# Patient Record
Sex: Female | Born: 1960 | Race: White | Hispanic: No | Marital: Married | Smoking: Former smoker
Health system: Southern US, Community
[De-identification: ages and names within clinical notes are randomized; demographics above are authoritative.]

## PROBLEM LIST (undated history)

## (undated) DIAGNOSIS — M8580 Other specified disorders of bone density and structure, unspecified site: Secondary | ICD-10-CM

## (undated) HISTORY — PX: OTHER SURGICAL HISTORY: SHX169

---

## 2014-02-24 ENCOUNTER — Emergency Department: Payer: Self-pay | Admitting: Emergency Medicine

## 2014-02-24 LAB — CBC WITH DIFFERENTIAL/PLATELET
BASOS PCT: 0.9 %
Basophil #: 0.1 10*3/uL (ref 0.0–0.1)
Eosinophil #: 0.1 10*3/uL (ref 0.0–0.7)
Eosinophil %: 2.3 %
HCT: 36.7 % (ref 35.0–47.0)
HGB: 12.7 g/dL (ref 12.0–16.0)
LYMPHS ABS: 2 10*3/uL (ref 1.0–3.6)
Lymphocyte %: 29.9 %
MCH: 33.5 pg (ref 26.0–34.0)
MCHC: 34.5 g/dL (ref 32.0–36.0)
MCV: 97 fL (ref 80–100)
Monocyte #: 0.7 x10 3/mm (ref 0.2–0.9)
Monocyte %: 11.1 %
Neutrophil #: 3.7 10*3/uL (ref 1.4–6.5)
Neutrophil %: 55.8 %
Platelet: 260 10*3/uL (ref 150–440)
RBC: 3.78 10*6/uL — ABNORMAL LOW (ref 3.80–5.20)
RDW: 13 % (ref 11.5–14.5)
WBC: 6.5 10*3/uL (ref 3.6–11.0)

## 2014-02-24 LAB — BASIC METABOLIC PANEL
ANION GAP: 8 (ref 7–16)
BUN: 10 mg/dL (ref 7–18)
CO2: 28 mmol/L (ref 21–32)
Calcium, Total: 7.7 mg/dL — ABNORMAL LOW (ref 8.5–10.1)
Chloride: 108 mmol/L — ABNORMAL HIGH (ref 98–107)
Creatinine: 0.6 mg/dL (ref 0.60–1.30)
GLUCOSE: 108 mg/dL — AB (ref 65–99)
Osmolality: 286 (ref 275–301)
Potassium: 3.6 mmol/L (ref 3.5–5.1)
SODIUM: 144 mmol/L (ref 136–145)

## 2014-02-24 LAB — TROPONIN I: Troponin-I: <0.02 ng/mL

## 2014-02-24 LAB — URINALYSIS, COMPLETE
Bacteria: NONE SEEN
Bilirubin,UR: NEGATIVE
Glucose,UR: NEGATIVE mg/dL (ref 0–75)
Leukocyte Esterase: NEGATIVE
NITRITE: NEGATIVE
PH: 5 (ref 4.5–8.0)
RBC,UR: 14 /HPF (ref 0–5)
SPECIFIC GRAVITY: 1.019 (ref 1.003–1.030)
Squamous Epithelial: 1
WBC UR: 3 /HPF (ref 0–5)

## 2014-04-13 ENCOUNTER — Ambulatory Visit: Payer: Self-pay | Admitting: Family Medicine

## 2014-04-25 ENCOUNTER — Ambulatory Visit: Payer: Self-pay | Admitting: Family Medicine

## 2015-06-28 ENCOUNTER — Other Ambulatory Visit: Payer: Self-pay | Admitting: Gynecology

## 2015-06-28 DIAGNOSIS — Z1231 Encounter for screening mammogram for malignant neoplasm of breast: Secondary | ICD-10-CM

## 2015-06-28 DIAGNOSIS — R928 Other abnormal and inconclusive findings on diagnostic imaging of breast: Secondary | ICD-10-CM

## 2015-07-20 ENCOUNTER — Ambulatory Visit
Admission: RE | Admit: 2015-07-20 | Discharge: 2015-07-20 | Disposition: A | Discharge status: Home / Self Care | Payer: BLUE CROSS/BLUE SHIELD | Source: Ambulatory Visit | Attending: Gynecology | Admitting: Gynecology

## 2015-07-20 DIAGNOSIS — Z1231 Encounter for screening mammogram for malignant neoplasm of breast: Secondary | ICD-10-CM

## 2015-07-20 DIAGNOSIS — R928 Other abnormal and inconclusive findings on diagnostic imaging of breast: Secondary | ICD-10-CM

## 2015-08-08 ENCOUNTER — Other Ambulatory Visit: Payer: Self-pay | Admitting: Gynecology

## 2015-08-08 DIAGNOSIS — Z13820 Encounter for screening for osteoporosis: Secondary | ICD-10-CM

## 2015-08-09 ENCOUNTER — Other Ambulatory Visit: Payer: Self-pay | Admitting: Gynecology

## 2015-08-09 DIAGNOSIS — R928 Other abnormal and inconclusive findings on diagnostic imaging of breast: Secondary | ICD-10-CM

## 2015-09-13 ENCOUNTER — Ambulatory Visit: Payer: BLUE CROSS/BLUE SHIELD | Attending: Gynecology

## 2015-10-10 ENCOUNTER — Ambulatory Visit
Admission: RE | Admit: 2015-10-10 | Discharge: 2015-10-10 | Disposition: A | Discharge status: Home / Self Care | Payer: BLUE CROSS/BLUE SHIELD | Source: Ambulatory Visit | Attending: Gynecology | Admitting: Gynecology

## 2015-10-10 DIAGNOSIS — M8588 Other specified disorders of bone density and structure, other site: Secondary | ICD-10-CM | POA: Diagnosis not present

## 2015-10-10 DIAGNOSIS — M85851 Other specified disorders of bone density and structure, right thigh: Secondary | ICD-10-CM | POA: Diagnosis not present

## 2015-10-10 DIAGNOSIS — Z13820 Encounter for screening for osteoporosis: Secondary | ICD-10-CM | POA: Diagnosis not present

## 2016-07-21 ENCOUNTER — Ambulatory Visit
Admission: RE | Admit: 2016-07-21 | Discharge: 2016-07-21 | Disposition: A | Discharge status: Home / Self Care | Payer: BLUE CROSS/BLUE SHIELD | Source: Ambulatory Visit | Attending: Gynecology | Admitting: Gynecology

## 2016-07-21 DIAGNOSIS — R928 Other abnormal and inconclusive findings on diagnostic imaging of breast: Secondary | ICD-10-CM

## 2016-07-21 DIAGNOSIS — R922 Inconclusive mammogram: Secondary | ICD-10-CM | POA: Diagnosis present

## 2016-07-21 DIAGNOSIS — R921 Mammographic calcification found on diagnostic imaging of breast: Secondary | ICD-10-CM | POA: Insufficient documentation

## 2017-05-06 ENCOUNTER — Encounter: Payer: Self-pay | Admitting: Emergency Medicine

## 2017-05-06 ENCOUNTER — Other Ambulatory Visit: Payer: Self-pay

## 2017-05-06 ENCOUNTER — Ambulatory Visit
Admission: EM | Admit: 2017-05-06 | Discharge: 2017-05-06 | Disposition: A | Discharge status: Home / Self Care | Payer: BLUE CROSS/BLUE SHIELD | Attending: Internal Medicine | Admitting: Internal Medicine

## 2017-05-06 DIAGNOSIS — R300 Dysuria: Secondary | ICD-10-CM

## 2017-05-06 DIAGNOSIS — R350 Frequency of micturition: Secondary | ICD-10-CM

## 2017-05-06 HISTORY — DX: Other specified disorders of bone density and structure, unspecified site: M85.80

## 2017-05-06 LAB — URINALYSIS, COMPLETE (UACMP) WITH MICROSCOPIC
Bilirubin Urine: NEGATIVE
GLUCOSE, UA: NEGATIVE mg/dL
KETONES UR: NEGATIVE mg/dL
NITRITE: POSITIVE — AB
PH: 7 (ref 5.0–8.0)
Protein, ur: 30 mg/dL — AB
SPECIFIC GRAVITY, URINE: 1.015 (ref 1.005–1.030)
Squamous Epithelial / LPF: NONE SEEN

## 2017-05-06 MED ORDER — NITROFURANTOIN MONOHYD MACRO 100 MG PO CAPS: 100.0000 mg | ORAL_CAPSULE | Freq: Two times a day (BID) | ORAL | 0 refills | Status: DC

## 2017-05-06 MED ORDER — PHENAZOPYRIDINE HCL 200 MG PO TABS: 200.0000 mg | ORAL_TABLET | Freq: Three times a day (TID) | ORAL | 0 refills | Status: DC

## 2017-05-06 NOTE — ED Provider Notes (Signed)
[facility]    CSN: [id number] Arrival date & time: 05/06/17  1854     History   Chief Complaint Chief Complaint  Patient presents with  . Dysuria    HPI Jennifer Mccarty is a 57 y.o. female.   She presents today with recent history of intermittent flank/abdominal pain over the last couple weeks.  She had the onset today of severe urinary frequency, and dysuria.  No fever.  No vaginal discharge.  Has had some crampy abdominal discomfort and thought she might have diarrhea but has not.    HPI  Past Medical History:  Diagnosis Date  . Osteopenia     Past Surgical History:  Procedure Laterality Date  . kidney stone removal      Home Medications    Prior to Admission medications   Medication Sig Start Date End Date Taking? Authorizing Provider  Calcium-Magnesium-Vitamin D (CALCIUM 500 PO) Take 3 tablets by mouth daily.   Yes [provider]  Cholecalciferol (VITAMIN D3 PO) Take 1 tablet by mouth daily.   Yes [provider]  Sesame Oil OIL by Does not apply route 3 (three) times daily.   Yes [provider]  UNABLE TO FIND Biost   Yes [provider]  vitamin B-12 (CYANOCOBALAMIN) 1000 MCG tablet Take by mouth.   Yes [provider]  nitrofurantoin, macrocrystal-monohydrate, (MACROBID) 100 MG capsule Take 1 capsule (100 mg total) by mouth 2 (two) times daily. 05/06/17   Isa Rankin, MD  phenazopyridine (PYRIDIUM) 200 MG tablet Take 1 tablet (200 mg total) by mouth 3 (three) times daily. 05/06/17   Isa Rankin, MD    Family History Family History  Problem Relation Age of Onset  . Diabetes Mother   . Hemangiomas Mother        brain  . Pancreatic cancer Father   . Diabetes Father   . Hypertension Father   . Breast cancer Neg Hx     Social History Social History   Tobacco Use  . Smoking status: Former Smoker    Last attempt to quit: 1983    Years since quitting: 36.2  . Smokeless  tobacco: Never Used  Substance Use Topics  . Alcohol use: Never    Frequency: Never  . Drug use: Never     Allergies   Codeine and Wool alcohol  [lanolin]   Review of Systems Review of Systems  All other systems reviewed and are negative.    Physical Exam Triage Vital Signs ED Triage Vitals  Enc Vitals Group     BP 05/06/17 1903 98/61     Pulse Rate 05/06/17 1903 (!) 51     Resp 05/06/17 1903 16     Temp 05/06/17 1903 (!) 97.4 F (36.3 C)     Temp Source 05/06/17 1903 Oral     SpO2 05/06/17 1903 100 %     Weight 05/06/17 1904 113 lb (51.3 kg)     Height 05/06/17 1904 5\' 1"  (1.549 m)     Pain Score 05/06/17 1904 5     Pain Loc --    Updated Vital Signs BP 98/61 (BP Location: Left Arm)   Pulse (!) 51   Temp (!) 97.4 F (36.3 C) (Oral)   Resp 16   Ht 5\' 1"  (1.549 m)   Wt 113 lb (51.3 kg)   SpO2 100%   BMI 21.35 kg/m  Physical Exam  Constitutional: She is oriented to person, place, and time. No  distress.  HENT:  Head: Atraumatic.  Eyes:  Conjugate gaze observed, no eye redness/discharge  Neck: Neck supple.  Cardiovascular: Normal rate.  Pulmonary/Chest: No respiratory distress.  Abdominal: She exhibits no distension.  Musculoskeletal: Normal range of motion.  Neurological: She is alert and oriented to person, place, and time.  Skin: Skin is warm and dry.  Nursing note and vitals reviewed.    UC Treatments / Results  Labs Results for orders placed or performed during the hospital encounter of 05/06/17  Urine culture  Result Value Ref Range   Specimen Description      URINE, CLEAN CATCH Performed at [facility], [address] [place], [place] [address]    Special Requests      NONE Performed at [facility], [address] [place], [place] [address]    Culture >=100,000 COLONIES/mL ESCHERICHIA COLI (A)    Report Status 05/09/2017 FINAL    Organism ID, Bacteria ESCHERICHIA COLI (A)       Susceptibility   Escherichia  coli - MIC*    AMPICILLIN <=2 SENSITIVE Sensitive     CEFAZOLIN <=4 SENSITIVE Sensitive     CEFTRIAXONE <=1 SENSITIVE Sensitive     CIPROFLOXACIN <=0.25 SENSITIVE Sensitive     GENTAMICIN <=1 SENSITIVE Sensitive     IMIPENEM <=0.25 SENSITIVE Sensitive     NITROFURANTOIN <=16 SENSITIVE Sensitive     TRIMETH/SULFA <=20 SENSITIVE Sensitive     AMPICILLIN/SULBACTAM <=2 SENSITIVE Sensitive     PIP/TAZO <=4 SENSITIVE Sensitive     Extended ESBL NEGATIVE Sensitive     * >=100,000 COLONIES/mL ESCHERICHIA COLI  Urinalysis, Complete w Microscopic  Result Value Ref Range   Color, Urine YELLOW YELLOW   APPearance HAZY (A) CLEAR   Specific Gravity, Urine 1.015 1.005 - 1.030   pH 7.0 5.0 - 8.0   Glucose, UA NEGATIVE NEGATIVE mg/dL   Hgb urine dipstick LARGE (A) NEGATIVE   Bilirubin Urine NEGATIVE NEGATIVE   Ketones, ur NEGATIVE NEGATIVE mg/dL   Protein, ur 30 (A) NEGATIVE mg/dL   Nitrite POSITIVE (A) NEGATIVE   Leukocytes, UA MODERATE (A) NEGATIVE   Squamous Epithelial / LPF NONE SEEN NONE SEEN   WBC, UA 6-30 0 - 5 WBC/hpf   RBC / HPF TOO NUMEROUS TO COUNT 0 - 5 RBC/hpf   Bacteria, UA MANY (A) NONE SEEN    EKG None Radiology No results found.  Procedures Procedures (including critical care time) None today  Final Clinical Impressions(s) / UC Diagnoses   Final diagnoses:  Dysuria   Urine test at the urgent care today suggest a UTI; a urine culture is pending.  The urgent care will contact you if a change in treatment is needed.  Prescriptions for nitrofurantoin, antibiotic, and phenazopyridine, for urinary discomfort, were sent to the pharmacy.  Push fluids.  Recheck for further evaluation if symptoms are not improving in a few days.  ED Discharge Orders        Ordered    phenazopyridine (PYRIDIUM) 200 MG tablet  3 times daily     05/06/17 1950    nitrofurantoin, macrocrystal-monohydrate, (MACROBID) 100 MG capsule  2 times daily     05/06/17 1950        Isa RankinMurray, Myeasha Ballowe  Wilson, MD 05/09/17 2035

## 2017-05-06 NOTE — Discharge Instructions (Addendum)
Urine test at the urgent care today suggest a UTI; a urine culture is pending.  The urgent care will contact you if a change in treatment is needed.  Prescriptions for nitrofurantoin, antibiotic, and phenazopyridine, for urinary discomfort, were sent to the pharmacy.  Push fluids.  Recheck for further evaluation if symptoms are not improving in a few days.

## 2017-05-06 NOTE — ED Triage Notes (Signed)
Patient in today c/o urinary frequency, dysuria since this afternoon. Patient has a history of kidney stones and UTIs.

## 2017-05-09 LAB — URINE CULTURE

## 2017-05-15 ENCOUNTER — Other Ambulatory Visit: Payer: Self-pay

## 2017-05-15 ENCOUNTER — Ambulatory Visit
Admission: EM | Admit: 2017-05-15 | Discharge: 2017-05-15 | Disposition: A | Discharge status: Home / Self Care | Payer: BLUE CROSS/BLUE SHIELD | Attending: Emergency Medicine | Admitting: Emergency Medicine

## 2017-05-15 ENCOUNTER — Ambulatory Visit (INDEPENDENT_AMBULATORY_CARE_PROVIDER_SITE_OTHER): Payer: BLUE CROSS/BLUE SHIELD

## 2017-05-15 DIAGNOSIS — W01198A Fall on same level from slipping, tripping and stumbling with subsequent striking against other object, initial encounter: Secondary | ICD-10-CM

## 2017-05-15 DIAGNOSIS — S20211A Contusion of right front wall of thorax, initial encounter: Secondary | ICD-10-CM

## 2017-05-15 MED ORDER — HYDROCODONE-ACETAMINOPHEN 5-325 MG PO TABS: 1.0000 | ORAL_TABLET | Freq: Four times a day (QID) | ORAL | 0 refills | Status: AC | PRN

## 2017-05-15 NOTE — Discharge Instructions (Addendum)
There is no displaced rib fracture or collapsed lung on your x-rays, however, I still suspect that you may have a cracked rib.  I am giving you information on both a rib bruise and a rib fracture.  Take 400- 600 mg of ibuprofen with 1 g of Tylenol 3 or 4 times a day as needed for pain.  This is an effective combination for pain and provides relief almost equivalent to opiates.  Do not take both Norco and Tylenol but to take one or the other. Do Not exceed 4 g of Tylenol from all sources in 1 day.  You can get the incentive spirometer at a medical supply store.  Use the incentive spirometer several times an hour.  This will help prevent any pneumonia.  Follow-up with your primary care physician in a week if not getting better, go to ER if you get worse, fevers, pain not controlled with medications, shortness of breath, coughing up blood or other concerns.

## 2017-05-15 NOTE — ED Triage Notes (Signed)
Patient complains of right rib pain that occurred after a fall last night. Patient states that she was exercising [place] [employer] in [place] last night and caught her foot and fell to the ground with her body folded under her. Patient reports that area is very painful and hurts worse when taking a deep breath.

## 2017-05-15 NOTE — ED Provider Notes (Signed)
HPI  SUBJECTIVE:  Jennifer Mccarty is a 57 y.o. female who presents with constant, sore anterior right lower rib pain that becomes sharp with deep inspiration starting last night after she tripped and fell on the sidewalk.  States that she fell with her body folded onto her right rib cage.  She is not sure if she landed on her arm or knee.  Symptoms are worse with deep inspiration, torso rotation and pushing doors open.  She denies bruising, shortness of breath, dyspnea on exertion, hemoptysis, dizziness.  States that she cannot take a deep breath because of the pain.  No alleviating factors.  She has not tried anything for this.  He has a past medical history of osteopenia.  No history of rib fractures, pneumothorax, smoking, diabetes, hypertension, GI bleed, kidney disease.  PMD: [facility].    Past Medical History:  Diagnosis Date  . Osteopenia     Past Surgical History:  Procedure Laterality Date  . kidney stone removal      Family History  Problem Relation Age of Onset  . Diabetes Mother   . Hemangiomas Mother        brain  . Pancreatic cancer Father   . Diabetes Father   . Hypertension Father   . Breast cancer Neg Hx     Social History   Tobacco Use  . Smoking status: Former Smoker    Last attempt to quit: 1983    Years since quitting: 36.2  . Smokeless tobacco: Never Used  Substance Use Topics  . Alcohol use: Never    Frequency: Never  . Drug use: Never    No current facility-administered medications for this encounter.   Current Outpatient Medications:  .  Calcium-Magnesium-Vitamin D (CALCIUM 500 PO), Take 3 tablets by mouth daily., Disp: , Rfl:  .  Cholecalciferol (VITAMIN D3 PO), Take 1 tablet by mouth daily., Disp: , Rfl:  .  Sesame Oil OIL, by Does not apply route 3 (three) times daily., Disp: , Rfl:  .  UNABLE TO FIND, Biost, Disp: , Rfl:  .  vitamin B-12 (CYANOCOBALAMIN) 1000 MCG tablet, Take by mouth., Disp: , Rfl:  .   HYDROcodone-acetaminophen (NORCO/VICODIN) 5-325 MG tablet, Take 1-2 tablets by mouth every 6 (six) hours as needed for moderate pain or severe pain., Disp: 20 tablet, Rfl: 0  Allergies  Allergen Reactions  . Codeine Nausea Only  . Wool Alcohol  [Lanolin] Hives  . Adhesive [Tape] Rash     ROS  As noted in HPI.   Physical Exam  BP 92/65 (BP Location: Left Arm)   Pulse (!) 53   Temp 97.9 F (36.6 C) (Oral)   Resp 18   Ht 5\' 1"  (1.549 m)   Wt 114 lb (51.7 kg)   SpO2 100%   BMI 21.54 kg/m   Constitutional: Well developed, well nourished, mild painful distress Eyes:  EOMI, conjunctiva normal bilaterally HENT: Normocephalic, atraumatic,mucus membranes moist Respiratory:  Normal chest wall appearance.  No bruising.  Tenderness over the posterior right ribs.  No tenderness over the left entire rib cage.  Positive diffuse tenderness anteriorly, but positive exquisite point tenderness over right anterior rib where her underwire of the bra is located.  No paradoxical wall motion.  Limited inspiratory effort due to pain.  Lungs clear bilaterally. Cardiovascular: Regular bradycardia, no murmurs, rubs, gallops GI: nondistended skin: No rash, skin intact Musculoskeletal: No C-spine, T-spine tenderness.  No deformities Neurologic: Alert & oriented x 3, no focal neuro  deficits Psychiatric: Speech and behavior appropriate   ED Course   Medications - No data to display  Orders Placed This Encounter  Procedures  . DG Ribs Unilateral W/Chest Right    Standing Status:   Standing    Number of Occurrences:   1    Order Specific Question:   Reason for Exam (SYMPTOM  OR DIAGNOSIS REQUIRED)    Answer:   fall r/o R lower anterior rib fx    No results found for this or any previous visit (from the past 24 hour(s)). Dg Ribs Unilateral W/chest Right  Result Date: 05/15/2017 CLINICAL DATA:  Larey Seat last night with right anterior rib pain and difficulty breathing. EXAM: RIGHT RIBS AND CHEST - 3+  VIEW COMPARISON:  None. FINDINGS: Heart size is normal. Mediastinal shadows are normal. The lungs are clear. No pneumothorax or hemothorax. No rib fracture seen. Cartilage injury not excluded. IMPRESSION: Normal radiographs. No pneumothorax. No rib fracture seen. Cartilage injury not excluded. Electronically Signed   By: Paulina Fusi M.D.   On: 05/15/2017 14:52    ED Clinical Impression  Contusion of rib on right side, initial encounter   ED Assessment/Plan  [place] Narcotic database reviewed for this patient, and feel that the risk/benefit ratio today is favorable for proceeding with a prescription for controlled substance.  No Opiate prescriptions in 2 years.  Reviewed imaging independently.  No pneumothorax, displaced rib fracture.  See radiology report for full details.  1449-called radiology, the image is not available on their end yet.   Presentation consistent with a nondisplaced rib fracture vs contusion.  She does not have any evidence of pneumothorax or displaced rib fracture.  Will send home with 400- 600 mg of ibuprofen to take with 1 g of Tylenol 3-4 times a day as needed for pain, Norco for severe pain.  If that she does not need a prescription for ibuprofen.  Discussed with patient and family member for her to not take both Norco and Tylenol but to take one or the other.  Will write a prescription for an incentive spirometer.  She will need to follow-up with her primary care physician in a week if not getting better, ER if she gets worse, fevers, pain not controlled with medications, hemoptysis.  Discussed imaging, MDM, treatment plan, and plan for follow-up with patient. Discussed sn/sx that should prompt return to the ED. patient agrees with plan.   Meds ordered this encounter  Medications  . HYDROcodone-acetaminophen (NORCO/VICODIN) 5-325 MG tablet    Sig: Take 1-2 tablets by mouth every 6 (six) hours as needed for moderate pain or severe pain.    Dispense:  20 tablet    Refill:   0    *This clinic note was created using Scientist, clinical (histocompatibility and immunogenetics). Therefore, there may be occasional mistakes despite careful proofreading.   ?   Domenick Gong, MD 05/15/17 1501

## 2017-06-03 ENCOUNTER — Other Ambulatory Visit: Payer: Self-pay | Admitting: Gynecology

## 2017-06-03 DIAGNOSIS — R922 Inconclusive mammogram: Secondary | ICD-10-CM

## 2017-07-28 ENCOUNTER — Ambulatory Visit
Admission: RE | Admit: 2017-07-28 | Discharge: 2017-07-28 | Disposition: A | Discharge status: Home / Self Care | Payer: BLUE CROSS/BLUE SHIELD | Source: Ambulatory Visit | Attending: Gynecology | Admitting: Gynecology

## 2017-07-28 DIAGNOSIS — R922 Inconclusive mammogram: Secondary | ICD-10-CM

## 2017-07-28 DIAGNOSIS — R9230 Dense breasts, unspecified: Secondary | ICD-10-CM

## 2017-08-28 ENCOUNTER — Other Ambulatory Visit: Payer: Self-pay | Admitting: Gynecology

## 2017-08-28 DIAGNOSIS — Z13820 Encounter for screening for osteoporosis: Secondary | ICD-10-CM

## 2017-08-28 DIAGNOSIS — Z1231 Encounter for screening mammogram for malignant neoplasm of breast: Secondary | ICD-10-CM

## 2018-01-26 ENCOUNTER — Other Ambulatory Visit: Payer: Self-pay

## 2018-01-26 ENCOUNTER — Ambulatory Visit
Admission: EM | Admit: 2018-01-26 | Discharge: 2018-01-26 | Disposition: A | Discharge status: Home / Self Care | Payer: BLUE CROSS/BLUE SHIELD | Attending: Internal Medicine | Admitting: Internal Medicine

## 2018-01-26 DIAGNOSIS — M9918 Subluxation complex (vertebral) of rib cage: Secondary | ICD-10-CM | POA: Insufficient documentation

## 2018-01-26 NOTE — ED Triage Notes (Signed)
Patient complains of rib pain that occurred on 12/9 at 645 while exercising. States that she saw her chiropractor and was advised to come here and be seen for Chest x-ray. Patient states that she is dealing with a "red blood cell" issue and has not been exercising prior to this recently.

## 2018-09-01 ENCOUNTER — Other Ambulatory Visit: Payer: Self-pay | Admitting: Family Medicine

## 2018-09-01 ENCOUNTER — Other Ambulatory Visit: Payer: Self-pay | Admitting: Family

## 2018-09-01 DIAGNOSIS — Z1231 Encounter for screening mammogram for malignant neoplasm of breast: Secondary | ICD-10-CM

## 2018-10-27 ENCOUNTER — Ambulatory Visit: Payer: BLUE CROSS/BLUE SHIELD

## 2018-11-18 NOTE — ED Provider Notes (Signed)
[facility]    CSN: [id number] Arrival date & time: 01/26/18  0947      History   Chief Complaint Chief Complaint  Patient presents with  . Chest Pain    rib pain    HPI Jennifer Mccarty is a 58 y.o. female.   Pt c/o of pain along breast bone.  Started after a workout. She had not been exercising for a while and admits to chest exercises. Chest does not hurt with breathing, only with certain movement. Denies trauma to rib cage.  Denies SOB, N'V or diaphoresis.      Past Medical History:  Diagnosis Date  . Osteopenia     There are no active problems to display for this patient.   Past Surgical History:  Procedure Laterality Date  . kidney stone removal      OB History   No obstetric history on file.      Home Medications    Prior to Admission medications   Medication Sig Start Date End Date Taking? Authorizing Provider  Calcium-Magnesium-Vitamin D (CALCIUM 500 PO) Take 3 tablets by mouth daily.   Yes [provider]  Cholecalciferol (VITAMIN D3 PO) Take 1 tablet by mouth daily.   Yes [provider]  lactobacillus acidophilus (BACID) TABS tablet Take 2 tablets by mouth 3 (three) times daily.   Yes [provider]  levothyroxine (SYNTHROID, LEVOTHROID) 25 MCG tablet Take by mouth. 11/24/17 11/24/18 Yes [provider]  UNABLE TO FIND Biost   Yes [provider]  vitamin B-12 (CYANOCOBALAMIN) 1000 MCG tablet Take by mouth.   Yes [provider]  HYDROcodone-acetaminophen (NORCO/VICODIN) 5-325 MG tablet Take 1-2 tablets by mouth every 6 (six) hours as needed for moderate pain or severe pain. 05/15/17   Domenick GongMortenson, Ashley, MD  Sesame Oil OIL by Does not apply route 3 (three) times daily.    [provider]    Family History Family History  Problem Relation Age of Onset  . Diabetes Mother   . Hemangiomas Mother        brain  . Pancreatic cancer Father   . Diabetes Father   .  Hypertension Father   . Breast cancer Neg Hx     Social History Social History   Tobacco Use  . Smoking status: Former Smoker    Quit date: 1983    Years since quitting: 37.7  . Smokeless tobacco: Never Used  Substance Use Topics  . Alcohol use: Never    Frequency: Never  . Drug use: Never     Allergies   Codeine, Wool alcohol  [lanolin], and Adhesive [tape]   Review of Systems Review of Systems  Constitutional: Negative for chills and fever.  HENT: Negative for sore throat and tinnitus.   Eyes: Negative for redness.  Respiratory: Negative for cough and shortness of breath.   Cardiovascular: Positive for chest pain. Negative for palpitations.  Gastrointestinal: Negative for abdominal pain, diarrhea, nausea and vomiting.  Genitourinary: Negative for dysuria, frequency and urgency.  Musculoskeletal: Negative for myalgias.  Skin: Negative for rash.       No lesions  Neurological: Negative for weakness.  Hematological: Does not bruise/bleed easily.  Psychiatric/Behavioral: Negative for suicidal ideas.     Physical Exam Triage Vital Signs ED Triage Vitals  Enc Vitals Group     BP 01/26/18 1010 100/73     Pulse Rate 01/26/18 1010 (!) 52     Resp 01/26/18 1010 18     Temp  01/26/18 1010 98 F (36.7 C)     Temp Source 01/26/18 1010 Oral     SpO2 01/26/18 1010 100 %     Weight 01/26/18 1007 118 lb (53.5 kg)     Height 01/26/18 1007 5\' 1"  (1.549 m)     Head Circumference --      Peak Flow --      Pain Score 01/26/18 1007 5     Pain Loc --      Pain Edu? --      Excl. in [place]? --    No data found.  Updated Vital Signs BP 100/73 (BP Location: Left Arm)   Pulse (!) 52   Temp 98 F (36.7 C) (Oral)   Resp 18   Ht 5\' 1"  (1.549 m)   Wt 53.5 kg   SpO2 100%   BMI 22.30 kg/m   Visual Acuity Right Eye Distance:   Left Eye Distance:   Bilateral Distance:    Right Eye Near:   Left Eye Near:    Bilateral Near:     Physical Exam Vitals signs and nursing note  reviewed.  Constitutional:      General: She is not in acute distress.    Appearance: She is well-developed.  HENT:     Head: Normocephalic and atraumatic.  Eyes:     General: No scleral icterus.    Conjunctiva/sclera: Conjunctivae normal.     Pupils: Pupils are equal, round, and reactive to light.  Neck:     Musculoskeletal: Normal range of motion and neck supple.     Thyroid: No thyromegaly.     Vascular: No JVD.     Trachea: No tracheal deviation.  Cardiovascular:     Rate and Rhythm: Normal rate and regular rhythm.     Heart sounds: Normal heart sounds. No murmur. No friction rub. No gallop.   Pulmonary:     Effort: Pulmonary effort is normal.     Breath sounds: Normal breath sounds.  Chest:     Chest wall: Tenderness (with flexion of pecs contraction and along costochonral junction; slight movement noted) present.  Abdominal:     General: Bowel sounds are normal. There is no distension.     Palpations: Abdomen is soft.     Tenderness: There is no abdominal tenderness.  Musculoskeletal: Normal range of motion.  Lymphadenopathy:     Cervical: No cervical adenopathy.  Skin:    General: Skin is warm and dry.  Neurological:     Mental Status: She is alert and oriented to person, place, and time.     Cranial Nerves: No cranial nerve deficit.  Psychiatric:        Behavior: Behavior normal.        Thought Content: Thought content normal.        Judgment: Judgment normal.      UC Treatments / Results  Labs (all labs ordered are listed, but only abnormal results are displayed) Labs Reviewed - No data to display  EKG   Radiology No results found.  Procedures Procedures (including critical care time)  Medications Ordered in UC Medications - No data to display  Initial Impression / Assessment and Plan / UC Course  I have reviewed the triage vital signs and the nursing notes.  Pertinent labs & imaging results that were available during my care of the patient were  reviewed by me and considered in my medical decision making (see chart for details).     Pain is reproducible;  non-cardiac. Subluxed rib. Advised RICE. thereapy  Final Clinical Impressions(s) / UC Diagnoses   Final diagnoses:  Subluxation complex (vertebral) of rib cage   Discharge Instructions   None    ED Prescriptions    None     PDMP not reviewed this encounter.   Arnaldo Natal, MD 11/18/18 1124

## 2020-02-20 IMAGING — MG MM DIGITAL DIAGNOSTIC BILAT W/ TOMO W/ CAD
6 of 10 series · 6 of 26 positions shown · non-contrast
Comparison: Previous exam(s).

CLINICAL DATA: 56-year-old female for follow-up of probably benign
right breast calcifications. Patient reports intentional weight loss
of at least 20 pounds.

EXAM:
DIGITAL DIAGNOSTIC BILATERAL MAMMOGRAM WITH CAD AND TOMO

[R ML]
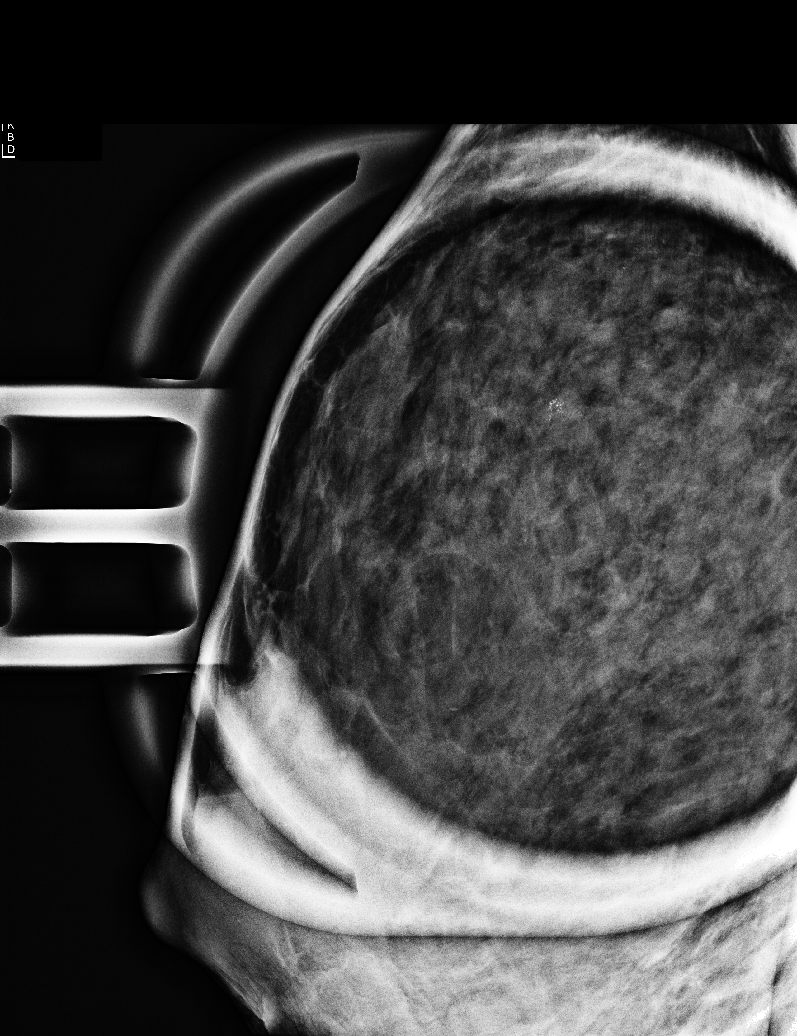

[R CC]
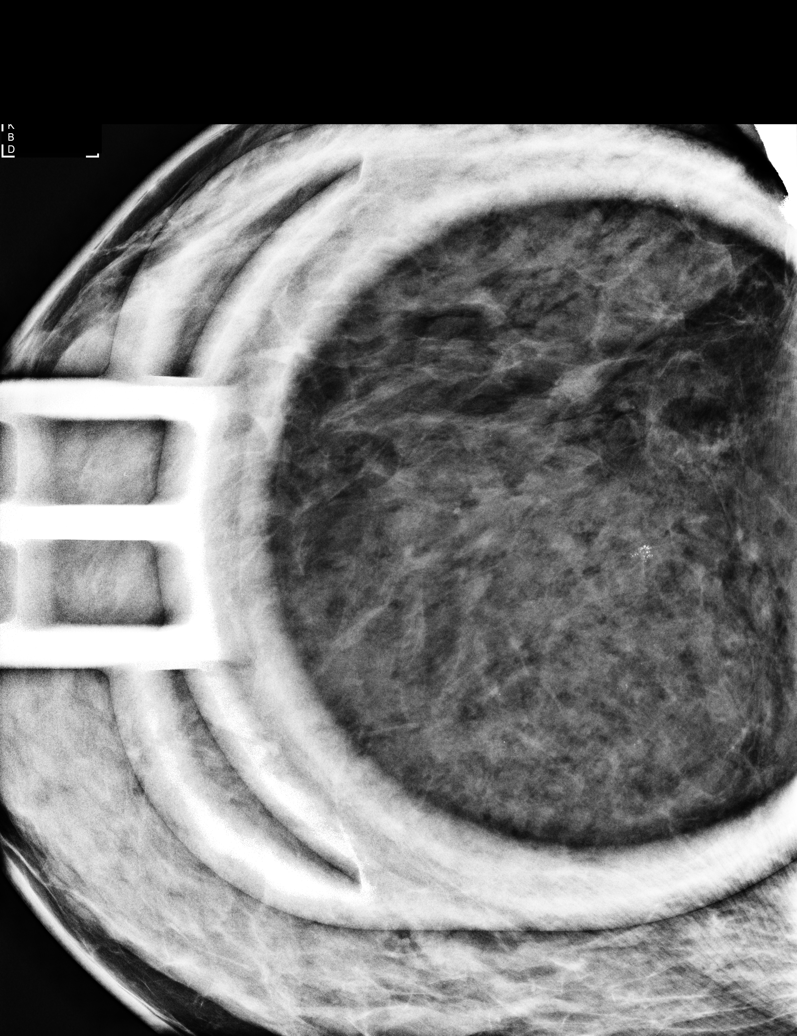

[L CC synth-2D]
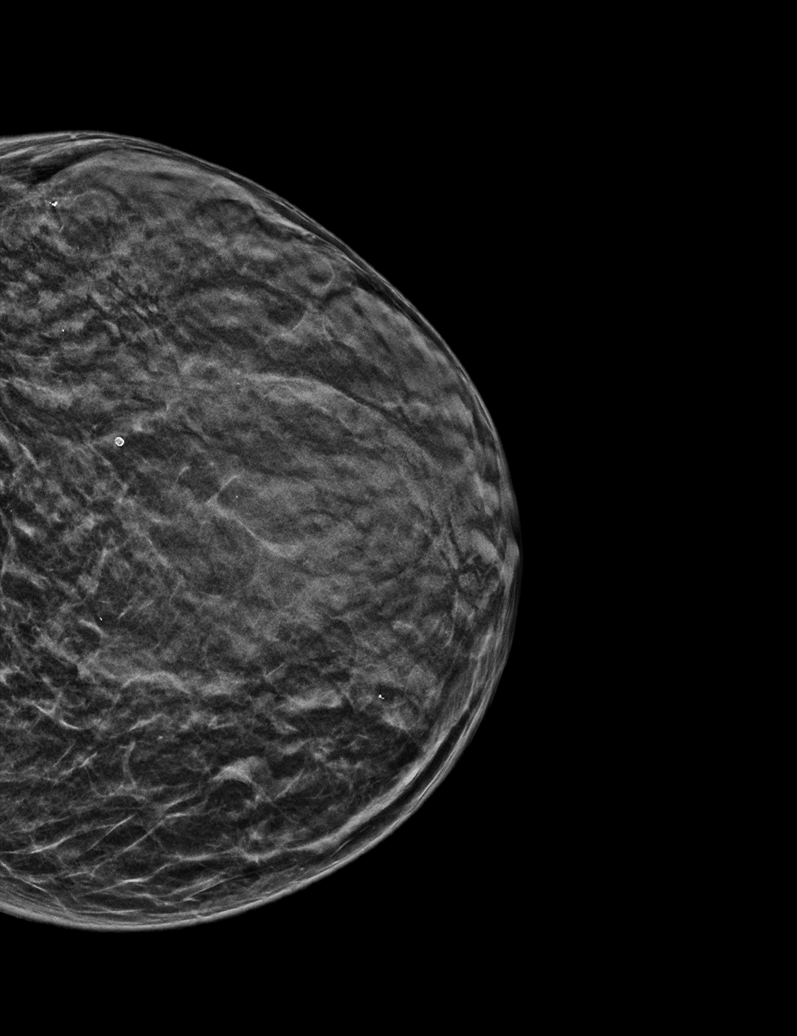

[R CC synth-2D]
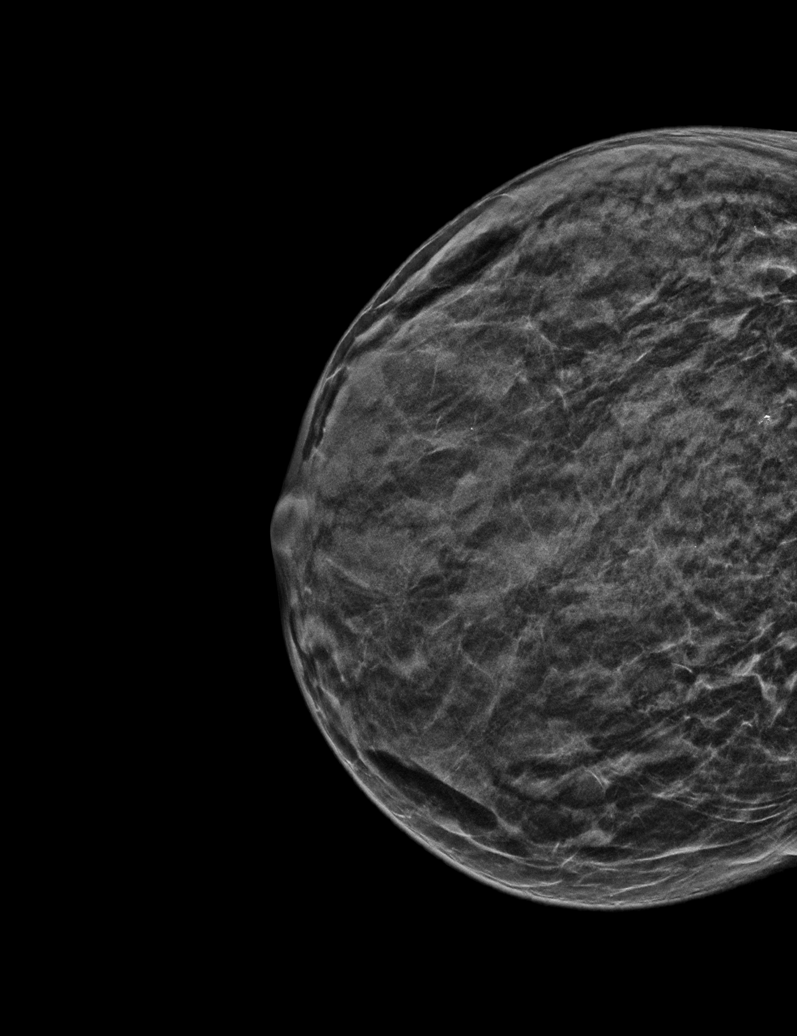

[L MLO synth-2D]
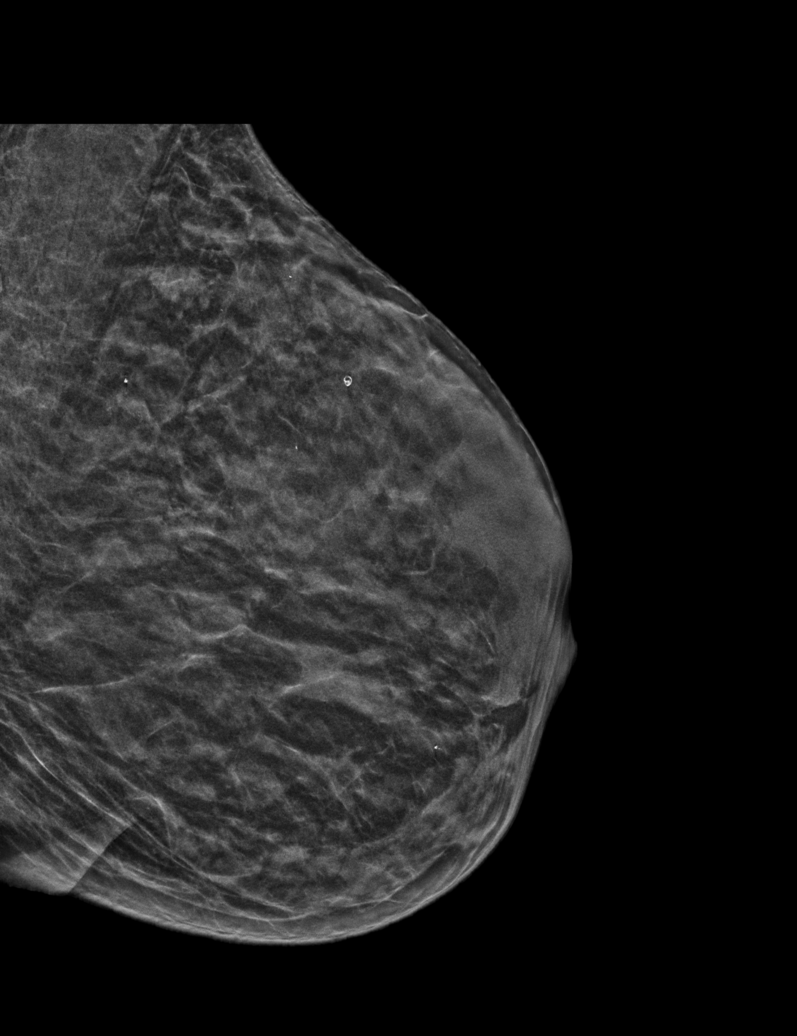

[R MLO synth-2D]
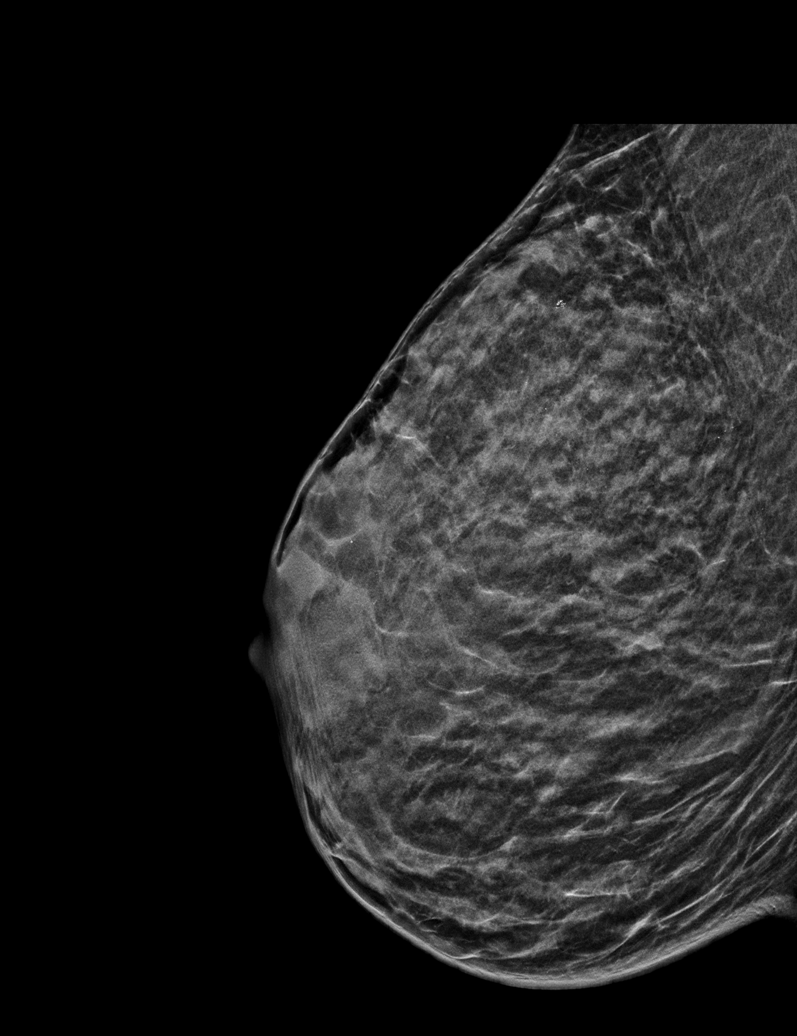

[6 of 26 positions shown; findings below may reference images not displayed]

ACR Breast Density Category d: The breast tissue is extremely dense,
which lowers the sensitivity of mammography.
FINDINGS: No suspicious masses or calcifications are seen in either breast.
The 3 mm group of punctate calcifications in the upper-outer right
breast appear unchanged. There is no mammographic evidence of
malignancy in either breast.

Mammographic images were processed with CAD.
IMPRESSION: No mammographic evidence of malignancy in either breast.

RECOMMENDATION:
Screening mammogram in one year.(Code:GX-T-HY5)

I have discussed the findings and recommendations with the patient.
Results were also provided in writing at the conclusion of the
visit. If applicable, a reminder letter will be sent to the patient
regarding the next appointment.

BI-RADS CATEGORY  2: Benign.
